# Patient Record
Sex: Male | Born: 1970 | Race: White | Hispanic: Yes | Marital: Married | Smoking: Former smoker
Health system: Southern US, Community
[De-identification: ages and names within clinical notes are randomized; demographics above are authoritative.]

## PROBLEM LIST (undated history)

## (undated) DIAGNOSIS — R222 Localized swelling, mass and lump, trunk: Secondary | ICD-10-CM

## (undated) DIAGNOSIS — R221 Localized swelling, mass and lump, neck: Secondary | ICD-10-CM

## (undated) HISTORY — DX: Localized swelling, mass and lump, trunk: R22.2

## (undated) HISTORY — PX: HERNIA REPAIR: SHX51

---

## 2003-09-29 ENCOUNTER — Emergency Department (HOSPITAL_COMMUNITY): Admission: EM | Admit: 2003-09-29 | Discharge: 2003-09-29 | Payer: Self-pay | Admitting: Emergency Medicine

## 2011-05-07 ENCOUNTER — Emergency Department (INDEPENDENT_AMBULATORY_CARE_PROVIDER_SITE_OTHER): Payer: BC Managed Care – PPO

## 2011-05-07 ENCOUNTER — Encounter (HOSPITAL_COMMUNITY): Payer: Self-pay | Admitting: *Deleted

## 2011-05-07 ENCOUNTER — Emergency Department (INDEPENDENT_AMBULATORY_CARE_PROVIDER_SITE_OTHER)
Admission: EM | Admit: 2011-05-07 | Discharge: 2011-05-07 | Disposition: A | Discharge status: Home / Self Care | Payer: BC Managed Care – PPO | Source: Home / Self Care | Attending: Emergency Medicine | Admitting: Emergency Medicine

## 2011-05-07 DIAGNOSIS — S5000XA Contusion of unspecified elbow, initial encounter: Secondary | ICD-10-CM

## 2011-05-07 DIAGNOSIS — S9000XA Contusion of unspecified ankle, initial encounter: Secondary | ICD-10-CM

## 2011-05-07 DIAGNOSIS — T148XXA Other injury of unspecified body region, initial encounter: Secondary | ICD-10-CM

## 2011-05-07 MED ORDER — IBUPROFEN 800 MG PO TABS: 800.0000 mg | ORAL_TABLET | Freq: Three times a day (TID) | ORAL | Status: AC

## 2011-05-07 NOTE — ED Notes (Signed)
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

## 2011-05-07 NOTE — ED Notes (Signed)
Elbow sleeve right elbow and ace wrap left ankle applied by tim forsythe emt

## 2011-05-07 NOTE — ED Provider Notes (Signed)
History     CSN: [id number]  Arrival date & time 05/07/11  1632   First MD Initiated Contact with Patient 05/07/11 1638      Chief Complaint  Patient presents with  . Arm Injury  . Elbow Injury  . 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 He had his seatbelt on a remain in sedentary the equipment he injured his left ankle and right elbow appears are hurting him the most. Patient denies any abdominal pain chest wall pain or headache. From area of incident came directly to urgent care to be evaluated. Denies any numbness paresthesias or weakness distal to the areas of injury.  Patient is a 40 y.o. male presenting with arm injury and lower extremity injury. The history is provided by the patient.  Arm Injury  The incident occurred today. The incident occurred at work. Injury mechanism: operating a backhoe flipped on side. The wounds were not self-inflicted. Pertinent negatives include no nausea and no neck pain.  Ankle Injury    History reviewed. No pertinent past medical history.  History reviewed. No pertinent past surgical history.  History reviewed. No pertinent family history.  History  Substance Use Topics  . Smoking status: Never Smoker   . Smokeless tobacco: Not on file  . Alcohol Use: Yes      Review of Systems  Constitutional: Negative for fever, chills, diaphoresis, activity change, appetite change and fatigue.  HENT: Positive for neck stiffness. Negative for neck pain.   Gastrointestinal: Negative for nausea.  Musculoskeletal: Positive for joint swelling.  Skin: Negative for wound.    Allergies  Review of patient's allergies indicates no known allergies.  Home Medications  No current outpatient prescriptions on file.  BP 119/68  Pulse 84  Temp(Src) 97.8 F (36.6 C) (Oral)  Resp 14  SpO2 99%  Physical Exam  Nursing  note and vitals reviewed. Constitutional: He appears well-developed and well-nourished.  HENT:  Head: Normocephalic.  Abdominal: Soft. He exhibits no distension. There is no tenderness.  Musculoskeletal: He exhibits tenderness.       Right elbow: He exhibits decreased range of motion. He exhibits no effusion and no deformity. tenderness found. Radial head, medial epicondyle, lateral epicondyle and olecranon process tenderness noted.       Arms:      Feet:  Neurological: He is alert.  Skin: No rash noted. No erythema.    ED Course  Procedures (including critical care time)  Labs Reviewed - No data to display Dg Elbow Complete Right  05/07/2011  *RADIOLOGY REPORT*  Clinical Data: Larey Seat and injured right elbow.  RIGHT ELBOW - COMPLETE 3+ VIEW 05/07/2011:  Comparison: None.  Findings: Soft tissue swelling overlying the elbow posteriorly.  No evidence of acute, subacute, or healed fractures.  Well-preserved joint spaces.  No intrinsic osseous abnormalities.  No posterior fat pad sign to confirm joint effusion or hemarthrosis.  IMPRESSION: No osseous abnormality.  Original Report Authenticated By: Arnell Sieving, M.D.   Dg Ankle Complete Left  05/07/2011  *RADIOLOGY REPORT*  Clinical Data: 41 year old male status post fall with pain.  LEFT ANKLE COMPLETE - 3+ VIEW  Comparison: None.  Findings: No joint effusion identified.  Mortise joint alignment preserved.  Talar dome intact. Bone mineralization is within normal limits.  No fracture or dislocation identified.  IMPRESSION: No acute fracture or dislocation identified about the left ankle.  Original Report Authenticated By: Harley Hallmark, M.D.  No diagnosis found.    MDM  Post accident with right elbow contusion and left ankle contusion and abrasions. X-rays were not consistent with an elbow effusion or indirect evidence of fractures. Symptomatic management and RICE approach recommended the orthopedic Dr. if symptoms were to persist  beyond 10-14 day       Jimmie Molly, MD 05/07/11 1752

## 2012-05-11 ENCOUNTER — Encounter (HOSPITAL_COMMUNITY): Payer: Self-pay | Admitting: Emergency Medicine

## 2012-05-11 ENCOUNTER — Emergency Department (HOSPITAL_COMMUNITY)
Admission: EM | Admit: 2012-05-11 | Discharge: 2012-05-11 | Disposition: A | Discharge status: Home / Self Care | Payer: BC Managed Care – PPO | Source: Home / Self Care | Attending: Emergency Medicine | Admitting: Emergency Medicine

## 2012-05-11 DIAGNOSIS — L0291 Cutaneous abscess, unspecified: Secondary | ICD-10-CM

## 2012-05-11 DIAGNOSIS — L0390 Cellulitis, unspecified: Secondary | ICD-10-CM

## 2012-05-11 MED ORDER — OXYCODONE-ACETAMINOPHEN 5-325 MG PO TABS: ORAL_TABLET | ORAL | Status: DC

## 2012-05-11 MED ORDER — IBUPROFEN 800 MG PO TABS
800.0000 mg | ORAL_TABLET | Freq: Once | ORAL | Status: AC
  Administered 2012-05-11: 800 mg via ORAL

## 2012-05-11 MED ORDER — SULFAMETHOXAZOLE-TMP DS 800-160 MG PO TABS: 2.0000 | ORAL_TABLET | Freq: Two times a day (BID) | ORAL | Status: DC

## 2012-05-11 MED ORDER — IBUPROFEN 800 MG PO TABS
ORAL_TABLET | ORAL | Status: AC
  Filled 2012-05-11: qty 1

## 2012-05-11 MED ORDER — HYDROCODONE-ACETAMINOPHEN 5-325 MG PO TABS
ORAL_TABLET | ORAL | Status: AC
  Filled 2012-05-11: qty 1

## 2012-05-11 MED ORDER — HYDROCODONE-ACETAMINOPHEN 5-325 MG PO TABS
1.0000 | ORAL_TABLET | Freq: Once | ORAL | Status: AC
  Administered 2012-05-11: 1 via ORAL

## 2012-05-11 NOTE — ED Notes (Signed)
Pt c/o abscess on abd area onset Monday Very painful w/some drainage  Denies fevers; pain is 10/10  He is alert and oriented w/no signs of acute distress.

## 2012-05-11 NOTE — ED Provider Notes (Signed)
Chief Complaint:   Chief Complaint  Patient presents with  . Abscess    History of Present Illness:    Bryce Ward is a 42 year old male who has had a one-week history of a painful abscess on his lower abdomen, just above the pubis. This is swollen, red, tender, as been draining a small amount of bloody pus. He denies any fever or chills. No prior history of abscesses or skin infections.  Review of Systems:  Other than noted above, the patient denies any of the following symptoms: Systemic:  No fever, chills or sweats. Skin:  No rash or itching.  PMFSH:  Past medical history, family history, social history, meds, and allergies were reviewed.  No history of diabetes or prior history of abscesses or MRSA.   Physical Exam:   Vital signs:  BP 150/80  Pulse 78  Temp(Src) 98.7 F (37.1 C) (Oral)  Resp 20  SpO2 100% Skin:  There is a 4 x 5 cm or, oval, raised, red, tender, abscess which is fluctuant on the lower abdomen at the midline, just above the pubis. There is a small central ulceration which is draining a small amount of serosanguineous fluid.  Skin exam was otherwise normal.  No rash. Ext:  Distal pulses were full, patient has full ROM of all joints.  Procedure:  Verbal informed consent was obtained.  The patient was informed of the risks and benefits of the procedure and understands and accepts.  Identity of the patient was verified verbally and by wristband.   The abscess area described above was prepped with Betadine and alcohol and anesthetized with 5 mL of 2% Xylocaine with epinephrine.  Using a #11 scalpel blade, a singe straight incision was made into the area of fluctulence, yielding a moderate or amount of prurulent drainage.  Routine cultures were obtained.  Blunt dissection was used to break up loculations and the resulting wound cavity was packed with 1/4 inch Iodoform gauze.  A sterile pressure dressing was applied.  Course in Urgent Care Center:   He was given Norco  5/325 and ibuprofen at a milligrams by mouth for pain.  Assessment:  The encounter diagnosis was Abscess.  Plan:   1.  The following meds were prescribed:   New Prescriptions   OXYCODONE-ACETAMINOPHEN (PERCOCET) 5-325 MG PER TABLET    1 to 2 tablets every 6 hours as needed for pain.   SULFAMETHOXAZOLE-TRIMETHOPRIM (BACTRIM DS) 800-160 MG PER TABLET    Take 2 tablets by mouth 2 (two) times daily.   2.  The patient was instructed in symptomatic care and handouts were given. 3.  The patient was instructed to leave the dressing in place and return again in 48 hours for packing removal.  Given red flag symptoms such as fever or worsening pain that would indicate earlier return.   Reuben Likes, MD 05/11/12 [phone]

## 2012-05-13 ENCOUNTER — Encounter (HOSPITAL_COMMUNITY): Payer: Self-pay | Admitting: Emergency Medicine

## 2012-05-13 ENCOUNTER — Emergency Department (INDEPENDENT_AMBULATORY_CARE_PROVIDER_SITE_OTHER)
Admission: EM | Admit: 2012-05-13 | Discharge: 2012-05-13 | Disposition: A | Discharge status: Home / Self Care | Payer: BC Managed Care – PPO | Source: Home / Self Care

## 2012-05-13 DIAGNOSIS — L0291 Cutaneous abscess, unspecified: Secondary | ICD-10-CM

## 2012-05-13 NOTE — ED Notes (Addendum)
Pt is here for possible packing removal.  Pt is still having drainage from area. Area is still swollen and red with some yellow drainage. Pt denies pain and fever.  Pt is taking antibiotics as prescribed.

## 2012-05-13 NOTE — ED Provider Notes (Signed)
History     CSN: [id number]  Arrival date & time 05/13/12  1424   First MD Initiated Contact with Patient 05/13/12 1438      Chief Complaint  Patient presents with  . Recurrent Skin Infections    pt here for possible packing removal    (Consider location/radiation/quality/duration/timing/severity/associated sxs/prior treatment) HPI Followup abscess. Patient was seen 2 days ago for skin abscess of his lower abdominal. This was treated with incision and drainage and antibiotics. In the interim he has done very well. His pain has resolved. He denies any fevers or chills. He is taking his antibiotics as directed. He notes some discharge.     History  Substance Use Topics  . Smoking status: Never Smoker   . Smokeless tobacco: Not on file  . Alcohol Use: Yes      Review of Systems no fevers or chills  Allergies  Review of patient's allergies indicates no known allergies.  Home Medications   Current Outpatient Rx  Name  Route  Sig  Dispense  Refill  . sulfamethoxazole-trimethoprim (BACTRIM DS) 800-160 MG per tablet   Oral   Take 2 tablets by mouth 2 (two) times daily.   40 tablet   0   . oxyCODONE-acetaminophen (PERCOCET) 5-325 MG per tablet      1 to 2 tablets every 6 hours as needed for pain.   20 tablet   0     There were no vitals taken for this visit.  Physical Exam GEN: Well NAD Skin: On present. No surrounding erythema. 4 inches of packing material removed.   ED Course  Procedures (including critical care time) Remove packing material. Debrided the wound with a Q-tip. Reinserted a small gauze.   Labs Reviewed - No data to display No results found.   1. Abscess       MDM  Packing removed and replaced today. Dressing reapplied. Plan to continue antibiotics. Remove packing on own in 2 weeks. Followup as needed        Rodolph Bong, MD 05/13/12 [phone]

## 2012-05-13 NOTE — ED Provider Notes (Signed)
Medical screening examination/treatment/procedure(s) were performed by -physician practitioner and as supervising physician I was immediately available for consultation/collaboration.  Shellsea Borunda   Christina Waldrop, MD 05/13/12 1607 

## 2012-05-14 LAB — CULTURE, ROUTINE-ABSCESS: Special Requests: NORMAL

## 2012-05-17 NOTE — ED Notes (Signed)
Abscess abdomen: Abundant Staph. Aureus.  Pt. adequately treated with Bactrim DS. Vassie Moselle 05/17/2012

## 2016-01-26 ENCOUNTER — Other Ambulatory Visit: Payer: Self-pay | Admitting: General Surgery

## 2016-01-26 DIAGNOSIS — R222 Localized swelling, mass and lump, trunk: Secondary | ICD-10-CM

## 2016-02-05 ENCOUNTER — Ambulatory Visit
Admission: RE | Admit: 2016-02-05 | Discharge: 2016-02-05 | Disposition: A | Discharge status: Home / Self Care | Payer: BLUE CROSS/BLUE SHIELD | Source: Ambulatory Visit | Attending: General Surgery | Admitting: General Surgery

## 2016-02-05 DIAGNOSIS — R222 Localized swelling, mass and lump, trunk: Secondary | ICD-10-CM

## 2016-02-05 MED ORDER — IOPAMIDOL (ISOVUE-300) INJECTION 61%
75.0000 mL | Freq: Once | INTRAVENOUS | Status: AC | PRN
  Administered 2016-02-05: 75 mL via INTRAVENOUS

## 2016-02-09 ENCOUNTER — Encounter: Payer: Self-pay | Admitting: *Deleted

## 2016-02-10 ENCOUNTER — Institutional Professional Consult (permissible substitution) (INDEPENDENT_AMBULATORY_CARE_PROVIDER_SITE_OTHER): Payer: BLUE CROSS/BLUE SHIELD | Admitting: Surgery

## 2016-02-10 ENCOUNTER — Other Ambulatory Visit: Payer: Self-pay | Admitting: *Deleted

## 2016-02-10 VITALS — BP 130/82 | HR 75 | Resp 16 | Ht 68.0 in | Wt 205.0 lb

## 2016-02-10 DIAGNOSIS — R221 Localized swelling, mass and lump, neck: Secondary | ICD-10-CM

## 2016-02-10 DIAGNOSIS — R222 Localized swelling, mass and lump, trunk: Secondary | ICD-10-CM | POA: Diagnosis not present

## 2016-02-10 NOTE — Progress Notes (Signed)
Cardiothoracic Surgery Consultation   PCP is No primary care provider on file. Referring Provider is Jovita Kussmaul, MD  Chief Complaint  Patient presents with  . Referral    supraclavicular...CT CHEST 02/05/16    HPI:  The patient is a 46 year old Hispanic gentleman who does not speak English but is here with his daughter who does speak [place] well. The patient and his wife, who is also here, understand some [place]. He has noticed a mass at the base of the right neck for about the past two years. It has been slowly growing. It has not caused much symptoms so he has ignored it for the most part. He was reportedly seen by a physician at some point and put on antibiotics but it did not resolve and he never went back. He does note some mild discomfort with raising the right arm above his head. He denies any fever or chills, night sweats, weight loss or loss of appetite. He has not noted any change in his voice or difficulty swallowing. He was seen by Dr. Marlou Starks on 01/22/2016 and subsequently had a chest CT to evaluate this. It shows a 5 cm intermediate density mass posterior to the lower right SCM muscle. There is no significant lymphadenopathy and no other significant abnormality.  Past Medical History:  Diagnosis Date  . Supraclavicular mass     History reviewed. No pertinent surgical history.  Family History  Problem Relation Age of Onset  . Diabetes Mother     Social History Social History  Substance Use Topics  . Smoking status: Former Smoker    Packs/day: 1.00    Years: 20.00    Quit date: 02/10/2007  . Smokeless tobacco: Never Used  . Alcohol use Yes     Comment: occasional, FEW BEERS ONCE A WEEK    No current outpatient prescriptions on file.   No current facility-administered medications for this visit.     No Known Allergies  Review of Systems  Constitutional: Negative.  Negative for appetite change, fever and unexpected weight change.  HENT: Negative for  dental problem, ear pain, facial swelling, mouth sores, sinus pain, sinus pressure, sore throat, trouble swallowing and voice change.   Eyes: Negative.   Respiratory: Negative.  Negative for cough, choking, shortness of breath and wheezing.   Cardiovascular: Negative for chest pain.  Gastrointestinal: Negative.   Endocrine: Negative.   Genitourinary: Negative.   Musculoskeletal: Negative.   Skin: Negative.   Neurological: Negative.   Hematological: Negative.   Psychiatric/Behavioral: Negative.     BP 130/82 (BP Location: Right Arm, Patient Position: Sitting, Cuff Size: Large)   Pulse 75   Resp 16   Ht 5\' 8"  (1.727 m)   Wt 205 lb (93 kg)   BMI 31.17 kg/m  Physical Exam  Constitutional: He is oriented to person, place, and time. He appears well-developed and well-nourished. No distress.  HENT:  Head: Normocephalic and atraumatic.  Mouth/Throat: Oropharynx is clear and moist.  Eyes: EOM are normal.  Neck: Normal range of motion. Neck supple. No JVD present. No tracheal deviation present. No thyromegaly present.  Firm mass palpable deep to the right lower SCM muscle that is not particularly mobile.  Cardiovascular: Normal rate, regular rhythm and normal heart sounds.   No murmur heard. Pulmonary/Chest: Effort normal. No stridor. No respiratory distress. He has no wheezes.  Abdominal: Soft. Bowel sounds are normal. He exhibits no distension. There is no tenderness.  Musculoskeletal: Normal range of motion.  He exhibits no edema.  Lymphadenopathy:    He has no cervical adenopathy.  Neurological: He is alert and oriented to person, place, and time. No cranial nerve deficit or sensory deficit.  Skin: Skin is warm and dry.  Tattoos over chest that are old according to patient.  Psychiatric: He has a normal mood and affect.     Diagnostic Tests:  CLINICAL DATA:  46 year old male with right supraclavicular mass discovered 8 months ago, non painful, although the patient does  have discomfort when he raises is right arm. Initial encounter.  EXAM: CT CHEST WITH CONTRAST  TECHNIQUE: Multidetector CT imaging of the chest was performed during intravenous contrast administration.  CONTRAST:  63mL ISOVUE-300 IOPAMIDOL (ISOVUE-300) INJECTION 61%  COMPARISON:  None.  FINDINGS: Cardiovascular: Negative. No pericardial effusion. No calcified atherosclerosis is evident.  Mediastinum/Nodes: Negative, small mediastinal lymph nodes are within normal limits. No mediastinal mass.  Lungs/Pleura: Major airways are patent. The right lung is clear aside from mild dependent atelectasis in the form of depending ground-glass opacity. The left lung is similarly clear aside from minor dependent atelectasis. No pleural effusion.  Upper Abdomen: Negative visualized liver, gallbladder, spleen, pancreas, adrenal glands, kidneys, and bowel in the upper abdomen.  Musculoskeletal: No osseous abnormality identified.  Other findings: Circumscribed 35 by 50 x 41 mm (AP by transverse by CC) intermediate density mass at the right floor aspect inlet corresponding to the right level 4 nodal station, lateral to the lower right carotid space and deep to the lower right sternocleidomastoid muscle. See series 3, images 15 and 16 and coronal images 48 and 49. Regional mass effect including mild mass effect on the nearby right thyroid lobe which otherwise appears normal. The mass is 60-70 Hounsfield units, similar to skeletal muscle.  Other visible thoracic inlet and level 3 lymph nodes are diminutive and normal. No other thoracic or visible lower neck soft tissue abnormality.  No axillary lymphadenopathy.  IMPRESSION: 1. Positive for a 5 cm round intermediate density soft tissue mass at the right thoracic inlet. The mass is occupying the right level 4 nodal station but there is no other lower neck or chest lymphadenopathy, and no related thyroid or mediastinal  mass. Consider Hodgkin lymphoma, and query B symptoms. Recommend histologic evaluation with either Ultrasound-guided (core) needle biopsy versus a Surgical biopsy. 2. Otherwise negative CT appearance of the chest and upper abdomen. These results will be called to the ordering clinician or representative by the Radiologist Assistant, and communication documented in the PACS or zVision Dashboard.   Electronically Signed   By: Genevie Ann M.D.   On: 02/05/2016 10:01   Impression:  The otherwise healthy smoker has a 5 cm firm mass beneath the right SCM muscle that has been slowly enlarging over the past two years according to him. On CT this does not appear to be actively invading any structures and its slow growth over two years makes me think more about a benign process although lymphoma is still on the list. It looks like it is amenable to removal through an incision along the lower right anterior SCM muscle. I told him that there is a possibility that I may not be able to completely remove it if it is attached to any vital structures. I discussed the surgical procedure with him and his wife via his daughter as Optometrist. I discussed the alternative of a core needle biopsy which I don't think is best for him. I discussed the benefits and risks of the surgery  including but not limited to bleeding, infection, injury to surrounding structures, a space where the tumor is removed, incomplete removal and recurrence. I also discussed the possibility that he will require additional treatment depending on what this turns out to be. He says that he understands and agrees to proceed. I reviewed the CT scan films with them and answered all of their questions.  Plan:  Excisional biopsy of the right neck mass on Tuesday 02/16/2016.   I spent 30 minutes performing this consultation and > 50% of this time was spent face to face counseling and coordinating the care of this patient's right neck mass.  Gaye Pollack, MD [facility] [phone]

## 2016-02-15 ENCOUNTER — Encounter (HOSPITAL_COMMUNITY): Payer: Self-pay | Admitting: *Deleted

## 2016-02-15 NOTE — Progress Notes (Signed)
Pt SDW-Pre-op call completed by interpreter Alex # [phone]. Pt denies SOB, chest pain, and being under the care of a cardiologist. Pt denies having a stress test, echo and cardiac cath. Pt denies having a chest x ray and EKG within the last year. Pt made aware to stop taking Aspirin, vitamins, fish oil and herbal medications. Do not take any NSAIDs ie: Ibuprofen, Advil, Naproxen ( Anaprox), BC and Goody Powder or any medication containing Aspirin. Pt verbalized understanding of all pre-op instructions.

## 2016-02-16 ENCOUNTER — Observation Stay (HOSPITAL_COMMUNITY)
Admission: AD | Admit: 2016-02-16 | Discharge: 2016-02-17 | DRG: 572 | Disposition: A | Discharge status: Home / Self Care | Payer: BLUE CROSS/BLUE SHIELD | Source: Ambulatory Visit | Attending: Surgery | Admitting: Surgery

## 2016-02-16 ENCOUNTER — Encounter (HOSPITAL_COMMUNITY)
Admission: AD | Disposition: A | Discharge status: Home / Self Care | Payer: Self-pay | Source: Ambulatory Visit | Attending: Surgery

## 2016-02-16 ENCOUNTER — Ambulatory Visit (HOSPITAL_COMMUNITY): Payer: BLUE CROSS/BLUE SHIELD | Admitting: Certified Registered Nurse Anesthetist

## 2016-02-16 ENCOUNTER — Inpatient Hospital Stay (HOSPITAL_COMMUNITY): Payer: BLUE CROSS/BLUE SHIELD

## 2016-02-16 ENCOUNTER — Encounter (HOSPITAL_COMMUNITY): Payer: Self-pay | Admitting: *Deleted

## 2016-02-16 ENCOUNTER — Ambulatory Visit (HOSPITAL_COMMUNITY): Payer: BLUE CROSS/BLUE SHIELD

## 2016-02-16 DIAGNOSIS — D1801 Hemangioma of skin and subcutaneous tissue: Secondary | ICD-10-CM | POA: Diagnosis not present

## 2016-02-16 DIAGNOSIS — M7981 Nontraumatic hematoma of soft tissue: Principal | ICD-10-CM | POA: Insufficient documentation

## 2016-02-16 DIAGNOSIS — R222 Localized swelling, mass and lump, trunk: Secondary | ICD-10-CM

## 2016-02-16 DIAGNOSIS — R221 Localized swelling, mass and lump, neck: Secondary | ICD-10-CM

## 2016-02-16 DIAGNOSIS — Z87891 Personal history of nicotine dependence: Secondary | ICD-10-CM | POA: Diagnosis not present

## 2016-02-16 HISTORY — PX: MASS EXCISION: SHX2000

## 2016-02-16 HISTORY — DX: Localized swelling, mass and lump, neck: R22.1

## 2016-02-16 LAB — CBC
HEMATOCRIT: 48 % (ref 39.0–52.0)
HEMOGLOBIN: 16.4 g/dL (ref 13.0–17.0)
MCH: 30.4 pg (ref 26.0–34.0)
MCHC: 34.2 g/dL (ref 30.0–36.0)
MCV: 88.9 fL (ref 78.0–100.0)
PLATELETS: 156 10*3/uL (ref 150–400)
RBC: 5.4 MIL/uL (ref 4.22–5.81)
RDW: 13 % (ref 11.5–15.5)
WBC: 5.8 10*3/uL (ref 4.0–10.5)

## 2016-02-16 LAB — COMPREHENSIVE METABOLIC PANEL
ALT: 28 U/L (ref 17–63)
ANION GAP: 9 (ref 5–15)
AST: 28 U/L (ref 15–41)
Albumin: 4.1 g/dL (ref 3.5–5.0)
Alkaline Phosphatase: 89 U/L (ref 38–126)
BUN: 12 mg/dL (ref 6–20)
CHLORIDE: 100 mmol/L — AB (ref 101–111)
CO2: 28 mmol/L (ref 22–32)
Calcium: 9.4 mg/dL (ref 8.9–10.3)
Creatinine, Ser: 0.8 mg/dL (ref 0.61–1.24)
Glucose, Bld: 125 mg/dL — ABNORMAL HIGH (ref 65–99)
POTASSIUM: 3.1 mmol/L — AB (ref 3.5–5.1)
Sodium: 137 mmol/L (ref 135–145)
Total Bilirubin: 1.2 mg/dL (ref 0.3–1.2)
Total Protein: 7.7 g/dL (ref 6.5–8.1)

## 2016-02-16 LAB — SURGICAL PCR SCREEN
MRSA, PCR: NEGATIVE
Staphylococcus aureus: NEGATIVE

## 2016-02-16 LAB — TYPE AND SCREEN
ABO/RH(D): A POS
ANTIBODY SCREEN: NEGATIVE

## 2016-02-16 LAB — PROTIME-INR
INR: 0.99
PROTHROMBIN TIME: 13.1 s (ref 11.4–15.2)

## 2016-02-16 LAB — ABO/RH: ABO/RH(D): A POS

## 2016-02-16 LAB — APTT: APTT: 29 s (ref 24–36)

## 2016-02-16 SURGERY — EXCISION MASS
Anesthesia: General | Site: Neck | Laterality: Right

## 2016-02-16 MED ORDER — SODIUM CHLORIDE 0.9 % IV SOLN
30.0000 meq | Freq: Every day | INTRAVENOUS | Status: DC | PRN
  Administered 2016-02-16: 30 meq via INTRAVENOUS
  Filled 2016-02-16 (×3): qty 15

## 2016-02-16 MED ORDER — 0.9 % SODIUM CHLORIDE (POUR BTL) OPTIME
TOPICAL | Status: DC | PRN
  Administered 2016-02-16: 1000 mL

## 2016-02-16 MED ORDER — HYDROMORPHONE HCL 1 MG/ML IJ SOLN
INTRAMUSCULAR | Status: AC
  Filled 2016-02-16: qty 0.5

## 2016-02-16 MED ORDER — HEPARIN SODIUM (PORCINE) 1000 UNIT/ML IJ SOLN
INTRAMUSCULAR | Status: AC
  Filled 2016-02-16: qty 2

## 2016-02-16 MED ORDER — POVIDONE-IODINE 10 % EX OINT
TOPICAL_OINTMENT | CUTANEOUS | Status: AC
  Filled 2016-02-16: qty 28.35

## 2016-02-16 MED ORDER — FENTANYL CITRATE (PF) 250 MCG/5ML IJ SOLN
INTRAMUSCULAR | Status: AC
  Filled 2016-02-16: qty 5

## 2016-02-16 MED ORDER — MUPIROCIN 2 % EX OINT
1.0000 "application " | TOPICAL_OINTMENT | Freq: Once | CUTANEOUS | Status: AC
  Administered 2016-02-16: 1 via TOPICAL
  Filled 2016-02-16: qty 22

## 2016-02-16 MED ORDER — EPHEDRINE SULFATE 50 MG/ML IJ SOLN
INTRAMUSCULAR | Status: DC | PRN
  Administered 2016-02-16: 5 mg via INTRAVENOUS

## 2016-02-16 MED ORDER — ACETAMINOPHEN 160 MG/5ML PO SOLN: 1000.0000 mg | Freq: Four times a day (QID) | ORAL | Status: DC

## 2016-02-16 MED ORDER — LIDOCAINE 2% (20 MG/ML) 5 ML SYRINGE
INTRAMUSCULAR | Status: DC | PRN
  Administered 2016-02-16: 100 mg via INTRAVENOUS

## 2016-02-16 MED ORDER — DEXTROSE-NACL 5-0.9 % IV SOLN: INTRAVENOUS | Status: DC

## 2016-02-16 MED ORDER — ONDANSETRON HCL 4 MG/2ML IJ SOLN
INTRAMUSCULAR | Status: AC
  Filled 2016-02-16: qty 2

## 2016-02-16 MED ORDER — ACETAMINOPHEN 500 MG PO TABS: 1000.0000 mg | ORAL_TABLET | Freq: Four times a day (QID) | ORAL | Status: DC

## 2016-02-16 MED ORDER — SENNOSIDES-DOCUSATE SODIUM 8.6-50 MG PO TABS: 1.0000 | ORAL_TABLET | Freq: Every day | ORAL | Status: DC

## 2016-02-16 MED ORDER — LIDOCAINE HCL (CARDIAC) 20 MG/ML IV SOLN
INTRAVENOUS | Status: DC | PRN
  Administered 2016-02-16: 100 mg via INTRAVENOUS

## 2016-02-16 MED ORDER — MIDAZOLAM HCL 2 MG/2ML IJ SOLN
INTRAMUSCULAR | Status: AC
  Filled 2016-02-16: qty 2

## 2016-02-16 MED ORDER — TRAMADOL HCL 50 MG PO TABS: 50.0000 mg | ORAL_TABLET | Freq: Four times a day (QID) | ORAL | Status: DC | PRN

## 2016-02-16 MED ORDER — PROMETHAZINE HCL 25 MG/ML IJ SOLN
6.2500 mg | INTRAMUSCULAR | Status: DC | PRN
Start: 2016-02-16 — End: 2016-02-16

## 2016-02-16 MED ORDER — LIDOCAINE HCL (PF) 1 % IJ SOLN
INTRAMUSCULAR | Status: AC
  Filled 2016-02-16: qty 30

## 2016-02-16 MED ORDER — FENTANYL CITRATE (PF) 100 MCG/2ML IJ SOLN: 25.0000 ug | INTRAMUSCULAR | Status: DC | PRN

## 2016-02-16 MED ORDER — EPINEPHRINE PF 1 MG/10ML IJ SOSY
PREFILLED_SYRINGE | INTRAMUSCULAR | Status: AC
  Filled 2016-02-16: qty 10

## 2016-02-16 MED ORDER — MIDAZOLAM HCL 5 MG/5ML IJ SOLN
INTRAMUSCULAR | Status: DC | PRN
  Administered 2016-02-16: 2 mg via INTRAVENOUS

## 2016-02-16 MED ORDER — HYDROMORPHONE HCL 1 MG/ML IJ SOLN
0.2500 mg | INTRAMUSCULAR | Status: DC | PRN
  Administered 2016-02-16 (×2): 0.5 mg via INTRAVENOUS

## 2016-02-16 MED ORDER — ONDANSETRON HCL 4 MG/2ML IJ SOLN
INTRAMUSCULAR | Status: DC | PRN
  Administered 2016-02-16: 4 mg via INTRAVENOUS

## 2016-02-16 MED ORDER — OXYCODONE HCL 5 MG PO TABS: 5.0000 mg | ORAL_TABLET | ORAL | Status: DC | PRN

## 2016-02-16 MED ORDER — SUGAMMADEX SODIUM 200 MG/2ML IV SOLN
INTRAVENOUS | Status: AC
  Filled 2016-02-16: qty 2

## 2016-02-16 MED ORDER — PROPOFOL 10 MG/ML IV BOLUS
INTRAVENOUS | Status: AC
  Filled 2016-02-16: qty 40

## 2016-02-16 MED ORDER — DEXTROSE 5 % IV SOLN
1.5000 g | INTRAVENOUS | Status: AC
  Administered 2016-02-16: 1.5 g via INTRAVENOUS
  Filled 2016-02-16: qty 1.5

## 2016-02-16 MED ORDER — LIDOCAINE 2% (20 MG/ML) 5 ML SYRINGE
INTRAMUSCULAR | Status: AC
  Filled 2016-02-16: qty 10

## 2016-02-16 MED ORDER — PROPOFOL 10 MG/ML IV BOLUS
INTRAVENOUS | Status: DC | PRN
  Administered 2016-02-16: 200 mg via INTRAVENOUS

## 2016-02-16 MED ORDER — ROCURONIUM BROMIDE 50 MG/5ML IV SOSY
PREFILLED_SYRINGE | INTRAVENOUS | Status: AC
  Filled 2016-02-16: qty 5

## 2016-02-16 MED ORDER — PROTAMINE SULFATE 10 MG/ML IV SOLN
INTRAVENOUS | Status: AC
  Filled 2016-02-16: qty 10

## 2016-02-16 MED ORDER — ETOMIDATE 2 MG/ML IV SOLN
INTRAVENOUS | Status: AC
  Filled 2016-02-16: qty 10

## 2016-02-16 MED ORDER — LACTATED RINGERS IV SOLN
INTRAVENOUS | Status: DC
  Administered 2016-02-16 (×3): via INTRAVENOUS

## 2016-02-16 MED ORDER — EPHEDRINE 5 MG/ML INJ
INTRAVENOUS | Status: AC
  Filled 2016-02-16: qty 10

## 2016-02-16 MED ORDER — BISACODYL 5 MG PO TBEC: 10.0000 mg | DELAYED_RELEASE_TABLET | Freq: Every day | ORAL | Status: DC

## 2016-02-16 MED ORDER — ONDANSETRON HCL 4 MG/2ML IJ SOLN
4.0000 mg | Freq: Four times a day (QID) | INTRAMUSCULAR | Status: DC | PRN
  Administered 2016-02-16: 4 mg via INTRAVENOUS
  Filled 2016-02-16: qty 2

## 2016-02-16 MED ORDER — FENTANYL CITRATE (PF) 100 MCG/2ML IJ SOLN
INTRAMUSCULAR | Status: DC | PRN
  Administered 2016-02-16: 50 ug via INTRAVENOUS
  Administered 2016-02-16 (×4): 25 ug via INTRAVENOUS
  Administered 2016-02-16: 50 ug via INTRAVENOUS
  Administered 2016-02-16 (×2): 25 ug via INTRAVENOUS

## 2016-02-16 SURGICAL SUPPLY — 50 items
BAG DECANTER FOR FLEXI CONT (MISCELLANEOUS) IMPLANT
CANISTER SUCTION 2500CC (MISCELLANEOUS) ×3 IMPLANT
CLIP TI MEDIUM 6 (CLIP) ×10 IMPLANT
CLIP TI WIDE RED SMALL 6 (CLIP) ×2 IMPLANT
CONT SPEC 4OZ CLIKSEAL STRL BL (MISCELLANEOUS) ×2 IMPLANT
COVER SURGICAL LIGHT HANDLE (MISCELLANEOUS) ×5 IMPLANT
DRAIN CHANNEL 15F RND FF W/TCR (WOUND CARE) ×2 IMPLANT
DRAPE LAPAROSCOPIC ABDOMINAL (DRAPES) ×3 IMPLANT
ELECT BLADE 4.0 EZ CLEAN MEGAD (MISCELLANEOUS) ×3
ELECT REM PT RETURN 9FT ADLT (ELECTROSURGICAL) ×3
ELECTRODE BLDE 4.0 EZ CLN MEGD (MISCELLANEOUS) IMPLANT
ELECTRODE REM PT RTRN 9FT ADLT (ELECTROSURGICAL) ×1 IMPLANT
EVACUATOR SILICONE 100CC (DRAIN) ×2 IMPLANT
GAUZE SPONGE 4X4 12PLY STRL (GAUZE/BANDAGES/DRESSINGS) ×3 IMPLANT
GLOVE BIOGEL PI IND STRL 6.5 (GLOVE) IMPLANT
GLOVE BIOGEL PI INDICATOR 6.5 (GLOVE) ×4
GLOVE EUDERMIC 7 POWDERFREE (GLOVE) ×5 IMPLANT
GLOVE SURG SS PI 6.0 STRL IVOR (GLOVE) ×4 IMPLANT
GOWN STRL REUS W/ TWL LRG LVL3 (GOWN DISPOSABLE) ×2 IMPLANT
GOWN STRL REUS W/ TWL XL LVL3 (GOWN DISPOSABLE) ×1 IMPLANT
GOWN STRL REUS W/TWL LRG LVL3 (GOWN DISPOSABLE) ×15
GOWN STRL REUS W/TWL XL LVL3 (GOWN DISPOSABLE) ×3
KIT BASIN OR (CUSTOM PROCEDURE TRAY) ×3 IMPLANT
KIT ROOM TURNOVER OR (KITS) ×3 IMPLANT
LIQUID BAND (GAUZE/BANDAGES/DRESSINGS) ×2 IMPLANT
LOOP VESSEL MAXI BLUE (MISCELLANEOUS) ×2 IMPLANT
NS IRRIG 1000ML POUR BTL (IV SOLUTION) ×3 IMPLANT
PACK GENERAL/GYN (CUSTOM PROCEDURE TRAY) ×3 IMPLANT
PAD ARMBOARD 7.5X6 YLW CONV (MISCELLANEOUS) ×6 IMPLANT
PENCIL BUTTON HOLSTER BLD 10FT (ELECTRODE) ×2 IMPLANT
SPONGE GAUZE 4X4 12PLY STER LF (GAUZE/BANDAGES/DRESSINGS) ×2 IMPLANT
SPONGE INTESTINAL PEANUT (DISPOSABLE) ×2 IMPLANT
SPONGE LAP 18X18 X RAY DECT (DISPOSABLE) IMPLANT
SUT SILK 1 TIES 10X30 (SUTURE) ×3 IMPLANT
SUT SILK 2 0 (SUTURE) ×9
SUT SILK 2 0 SH CR/8 (SUTURE) ×2 IMPLANT
SUT SILK 2 0SH CR/8 30 (SUTURE) ×2 IMPLANT
SUT SILK 2-0 18XBRD TIE 12 (SUTURE) ×1 IMPLANT
SUT VIC AB 2-0 CT1 27 (SUTURE)
SUT VIC AB 2-0 CT1 TAPERPNT 27 (SUTURE) IMPLANT
SUT VIC AB 3-0 SH 27 (SUTURE) ×3
SUT VIC AB 3-0 SH 27X BRD (SUTURE) IMPLANT
SUT VIC AB 3-0 X1 27 (SUTURE) IMPLANT
SWAB COLLECTION DEVICE MRSA (MISCELLANEOUS) IMPLANT
SYR 30ML LL (SYRINGE) ×2 IMPLANT
TAPE CLOTH SURG 4X10 WHT LF (GAUZE/BANDAGES/DRESSINGS) ×2 IMPLANT
TOWEL OR 17X24 6PK STRL BLUE (TOWEL DISPOSABLE) ×3 IMPLANT
TOWEL OR 17X26 10 PK STRL BLUE (TOWEL DISPOSABLE) ×3 IMPLANT
TUBE ANAEROBIC SPECIMEN COL (MISCELLANEOUS) IMPLANT
WATER STERILE IRR 1000ML POUR (IV SOLUTION) ×3 IMPLANT

## 2016-02-16 NOTE — Anesthesia Preprocedure Evaluation (Signed)
Anesthesia Evaluation  Patient identified by MRN, date of birth, ID band Patient awake    Reviewed: Allergy & Precautions, NPO status , Patient's Chart, lab work & pertinent test results  Airway Mallampati: II       Dental  (+) Teeth Intact, Dental Advisory Given   Pulmonary neg pulmonary ROS, former smoker,    breath sounds clear to auscultation       Cardiovascular negative cardio ROS   Rhythm:Regular Rate:Normal     Neuro/Psych negative neurological ROS     GI/Hepatic negative GI ROS, Neg liver ROS,   Endo/Other  negative endocrine ROS  Renal/GU negative Renal ROS     Musculoskeletal   Abdominal   Peds  Hematology negative hematology ROS (+)   Anesthesia Other Findings   Reproductive/Obstetrics                             Anesthesia Physical Anesthesia Plan  ASA: I  Anesthesia Plan: General   Post-op Pain Management:    Induction: Intravenous  Airway Management Planned: LMA  Additional Equipment:   Intra-op Plan:   Post-operative Plan: Extubation in OR  Informed Consent: I have reviewed the patients History and Physical, chart, labs and discussed the procedure including the risks, benefits and alternatives for the proposed anesthesia with the patient or authorized representative who has indicated his/her understanding and acceptance.   Dental advisory given  Plan Discussed with:   Anesthesia Plan Comments:         Anesthesia Quick Evaluation

## 2016-02-16 NOTE — Anesthesia Procedure Notes (Signed)
Procedure Name: LMA Insertion Date/Time: 02/16/2016 11:42 AM Performed by: Valda Favia Pre-anesthesia Checklist: Patient identified, Emergency Drugs available, Suction available, Patient being monitored and Timeout performed Patient Re-evaluated:Patient Re-evaluated prior to inductionOxygen Delivery Method: Circle system utilized Preoxygenation: Pre-oxygenation with 100% oxygen Intubation Type: IV induction LMA: LMA inserted LMA Size: 5.0 Number of attempts: 1 Placement Confirmation: positive ETCO2 and breath sounds checked- equal and bilateral Tube secured with: Tape Dental Injury: Teeth and Oropharynx as per pre-operative assessment

## 2016-02-16 NOTE — Interval H&P Note (Signed)
History and Physical Interval Note:  02/16/2016 8:40 AM  Bryce Ward  has presented today for surgery, with the diagnosis of RIGHT NECK MASS  The various methods of treatment have been discussed with the patient and family. After consideration of risks, benefits and other options for treatment, the patient has consented to  Procedure(s): EXCISIONAL BIOPSY OF RIGHT NECK MASS (Right) as a surgical intervention .  The patient's history has been reviewed, patient examined, no change in status, stable for surgery.  I have reviewed the patient's chart and labs.  Questions were answered to the patient's satisfaction.     Gaye Pollack

## 2016-02-16 NOTE — Brief Op Note (Signed)
02/16/2016  3:38 PM  PATIENT:  Bryce Ward  46 y.o. male  PRE-OPERATIVE DIAGNOSIS:  RIGHT NECK MASS  POST-OPERATIVE DIAGNOSIS:  RIGHT NECK MASS  PROCEDURE:  Procedure(s): EXCISIONAL BIOPSY OF RIGHT NECK MASS (Right)  SURGEON:  Surgeon(s) and Role:    * Gaye Pollack, MD - Primary  PHYSICIAN ASSISTANT: Nicholes Rough, PA-C    ANESTHESIA:   general  EBL:  Total I/O In: 1000 [I.V.:1000] Out: -   BLOOD ADMINISTERED:none  DRAINS: (one 38F) Jackson-Pratt drain(s) with closed bulb suction in the right neck   LOCAL MEDICATIONS USED:  NONE  SPECIMEN:  Source of Specimen:  right supraclavicular mass  DISPOSITION OF SPECIMEN:  PATHOLOGY  COUNTS:  YES  TOURNIQUET:  * No tourniquets in log *  DICTATION: .Note written in EPIC  PLAN OF CARE: Admit to inpatient   PATIENT DISPOSITION:  PACU - hemodynamically stable.   Delay start of Pharmacological VTE agent (>24hrs) due to surgical blood loss or risk of bleeding: yes

## 2016-02-16 NOTE — Transfer of Care (Signed)
Immediate Anesthesia Transfer of Care Note  Patient: Temesgen Juedes  Procedure(s) Performed: Procedure(s): EXCISIONAL BIOPSY OF RIGHT NECK MASS (Right)  Patient Location: PACU  Anesthesia Type:General  Level of Consciousness: awake and alert   Airway & Oxygen Therapy: Patient Spontanous Breathing and Patient connected to nasal cannula oxygen  Post-op Assessment: Report given to RN and Post -op Vital signs reviewed and stable  Post vital signs: Reviewed and stable  Last Vitals:  Vitals:   02/16/16 0846 02/16/16 1557  BP: 131/80 122/77  Pulse: 80 90  Resp: 18 12  Temp: 36.8 C 36.3 C    Last Pain:  Vitals:   02/16/16 1557  TempSrc:   PainSc: Asleep      Patients Stated Pain Goal: 5 ([id number])  Complications: No apparent anesthesia complications

## 2016-02-16 NOTE — H&P (Signed)
[address]       [place] [address]             [phone]      Cardiothoracic Surgery History and Physical   PCP is No primary care provider on file. Referring Provider is Jovita Kussmaul, MD      Chief Complaint  Patient presents with  .     Right Supraclavicular mass    HPI:  The patient is a 46 year old Hispanic gentleman who does not speak English but is here with his daughter who does speak [place] well. The patient and his wife, who is also here, understand some [place]. He has noticed a mass at the base of the right neck for about the past two years. It has been slowly growing. It has not caused much symptoms so he has ignored it for the most part. He was reportedly seen by a physician at some point and put on antibiotics but it did not resolve and he never went back. He does note some mild discomfort with raising the right arm above his head. He denies any fever or chills, night sweats, weight loss or loss of appetite. He has not noted any change in his voice or difficulty swallowing. He was seen by Dr. Marlou Starks on 01/22/2016 and subsequently had a chest CT to evaluate this. It shows a 5 cm intermediate density mass posterior to the lower right SCM muscle. There is no significant lymphadenopathy and no other significant abnormality.      Past Medical History:  Diagnosis Date  . Supraclavicular mass     History reviewed. No pertinent surgical history.       Family History  Problem Relation Age of Onset  . Diabetes Mother     Social History        Social History  Substance Use Topics  . Smoking status: Former Smoker    Packs/day: 1.00    Years: 20.00    Quit date: 02/10/2007  . Smokeless tobacco: Never Used  . Alcohol use Yes      Comment: occasional, FEW BEERS ONCE A WEEK    No current outpatient prescriptions on file.   No current facility-administered medications for this visit.     No Known  Allergies  Review of Systems  Constitutional: Negative.  Negative for appetite change, fever and unexpected weight change.  HENT: Negative for dental problem, ear pain, facial swelling, mouth sores, sinus pain, sinus pressure, sore throat, trouble swallowing and voice change.   Eyes: Negative.   Respiratory: Negative.  Negative for cough, choking, shortness of breath and wheezing.   Cardiovascular: Negative for chest pain.  Gastrointestinal: Negative.   Endocrine: Negative.   Genitourinary: Negative.   Musculoskeletal: Negative.   Skin: Negative.   Neurological: Negative.   Hematological: Negative.   Psychiatric/Behavioral: Negative.     BP 130/82 (BP Location: Right Arm, Patient Position: Sitting, Cuff Size: Large)   Pulse 75   Resp 16   Ht 5\' 8"  (1.727 m)   Wt 205 lb (93 kg)   BMI 31.17 kg/m  Physical Exam  Constitutional: He is oriented to person, place, and time. He appears well-developed and well-nourished. No distress.  HENT:  Head: Normocephalic and atraumatic.  Mouth/Throat: Oropharynx is clear and moist.  Eyes: EOM are normal.  Neck: Normal range of motion. Neck supple. No JVD present. No tracheal deviation present. No thyromegaly present.  Firm mass palpable deep to the right  lower SCM muscle that is not particularly mobile.  Cardiovascular: Normal rate, regular rhythm and normal heart sounds.   No murmur heard. Pulmonary/Chest: Effort normal. No stridor. No respiratory distress. He has no wheezes.  Abdominal: Soft. Bowel sounds are normal. He exhibits no distension. There is no tenderness.  Musculoskeletal: Normal range of motion. He exhibits no edema.  Lymphadenopathy:    He has no cervical adenopathy.  Neurological: He is alert and oriented to person, place, and time. No cranial nerve deficit or sensory deficit.  Skin: Skin is warm and dry.  Tattoos over chest that are old according to patient.  Psychiatric: He has a normal mood and affect.      Diagnostic Tests:  CLINICAL DATA: 46 year old male with right supraclavicular mass discovered 8 months ago, non painful, although the patient does have discomfort when he raises is right arm. Initial encounter.  EXAM: CT CHEST WITH CONTRAST  TECHNIQUE: Multidetector CT imaging of the chest was performed during intravenous contrast administration.  CONTRAST: 10mL ISOVUE-300 IOPAMIDOL (ISOVUE-300) INJECTION 61%  COMPARISON: None.  FINDINGS: Cardiovascular: Negative. No pericardial effusion. No calcified atherosclerosis is evident.  Mediastinum/Nodes: Negative, small mediastinal lymph nodes are within normal limits. No mediastinal mass.  Lungs/Pleura: Major airways are patent. The right lung is clear aside from mild dependent atelectasis in the form of depending ground-glass opacity. The left lung is similarly clear aside from minor dependent atelectasis. No pleural effusion.  Upper Abdomen: Negative visualized liver, gallbladder, spleen, pancreas, adrenal glands, kidneys, and bowel in the upper abdomen.  Musculoskeletal: No osseous abnormality identified.  Other findings: Circumscribed 35 by 50 x 41 mm (AP by transverse by CC) intermediate density mass at the right floor aspect inlet corresponding to the right level 4 nodal station, lateral to the lower right carotid space and deep to the lower right sternocleidomastoid muscle. See series 3, images 15 and 16 and coronal images 48 and 49. Regional mass effect including mild mass effect on the nearby right thyroid lobe which otherwise appears normal. The mass is 60-70 Hounsfield units, similar to skeletal muscle.  Other visible thoracic inlet and level 3 lymph nodes are diminutive and normal. No other thoracic or visible lower neck soft tissue abnormality.  No axillary lymphadenopathy.  IMPRESSION: 1. Positive for a 5 cm round intermediate density soft tissue mass at the right thoracic  inlet. The mass is occupying the right level 4 nodal station but there is no other lower neck or chest lymphadenopathy, and no related thyroid or mediastinal mass. Consider Hodgkin lymphoma, and query B symptoms. Recommend histologic evaluation with either Ultrasound-guided (core) needle biopsy versus a Surgical biopsy. 2. Otherwise negative CT appearance of the chest and upper abdomen. These results will be called to the ordering clinician or representative by the Radiologist Assistant, and communication documented in the PACS or zVision Dashboard.   Electronically Signed By: Genevie Ann M.D. On: 02/05/2016 10:01   Impression:  The otherwise healthy smoker has a 5 cm firm mass beneath the right SCM muscle that has been slowly enlarging over the past two years according to him. On CT this does not appear to be actively invading any structures and its slow growth over two years makes me think more about a benign process although lymphoma is still on the list. It looks like it is amenable to removal through an incision along the lower right anterior SCM muscle. I told him that there is a possibility that I may not be able to completely remove it if  it is attached to any vital structures. I discussed the surgical procedure with him and his wife via his daughter as Optometrist. I discussed the alternative of a core needle biopsy which I don't think is best for him. I discussed the benefits and risks of the surgery including but not limited to bleeding, infection, injury to surrounding structures, a space where the tumor is removed, incomplete removal and recurrence. I also discussed the possibility that he will require additional treatment depending on what this turns out to be. He says that he understands and agrees to proceed. I reviewed the CT scan films with them and answered all of their questions.  Plan:  Excisional biopsy of the right neck mass on Tuesday 02/16/2016.  Gaye Pollack, MD [facility] [phone]

## 2016-02-16 NOTE — Progress Notes (Signed)
Patient ID: Bryce Ward, male   DOB: 08-16-1970, 46 y.o.   MRN: [id number]   SICU Evening Rounds:   Hemodynamically stable   Awake and alert, voice normal Taking liquids and swallowing fine Breathing well.  Urine output good  JP output minimal  CBC    Component Value Date/Time   WBC 5.8 02/16/2016 0836   RBC 5.40 02/16/2016 0836   HGB 16.4 02/16/2016 0836   HCT 48.0 02/16/2016 0836   PLT 156 02/16/2016 0836   MCV 88.9 02/16/2016 0836   MCH 30.4 02/16/2016 0836   MCHC 34.2 02/16/2016 0836   RDW 13.0 02/16/2016 0836     BMET    Component Value Date/Time   NA 137 02/16/2016 0836   K 3.1 (L) 02/16/2016 0836   CL 100 (L) 02/16/2016 0836   CO2 28 02/16/2016 0836   GLUCOSE 125 (H) 02/16/2016 0836   BUN 12 02/16/2016 0836   CREATININE 0.80 02/16/2016 0836   CALCIUM 9.4 02/16/2016 0836   GFRNONAA >60 02/16/2016 0836   GFRAA >60 02/16/2016 0836     A/P:  Stable postop course. Continue current plans

## 2016-02-17 ENCOUNTER — Encounter (HOSPITAL_COMMUNITY): Payer: Self-pay | Admitting: Physician Assistant

## 2016-02-17 DIAGNOSIS — M7981 Nontraumatic hematoma of soft tissue: Secondary | ICD-10-CM | POA: Diagnosis not present

## 2016-02-17 MED ORDER — OXYCODONE HCL 5 MG PO TABS: 5.0000 mg | ORAL_TABLET | ORAL | 0 refills | Status: DC | PRN

## 2016-02-17 NOTE — Discharge Instructions (Signed)
Cuidado de la incisin (Incision Care) La incisin es el corte que el cirujano realiza en el cuerpo. Despus de la [place], es necesario brindarle el cuidado necesario para evitar que se infecte. [place] los medicamentos solamente como se lo haya indicado el mdico.  Hay muchas maneras distintas de cerrar y cubrir un corte, como puntos, pegamento para la piel y tiras [place]. Siga las indicaciones del mdico para:  Cuidar del corte.  Cambiar y Press photographer el vendaje.  Quitar el cierre del corte.  No tome baos de inmersin, no practique natacin ni use el jacuzzi hasta que el mdico lo autorice. Puede ducharse como se lo haya indicado el mdico.  Reanude su dieta y sus actividades habituales como se lo haya indicado el mdico.  Use un medicamento que reduzca la picazn en el corte como se lo haya indicado el mdico. No se toque ni se rasque el corte.  Beba suficiente lquido para mantener el pis (orina) claro o de color amarillo plido. SOLICITE AYUDA SI:  Tiene enrojecimiento, hinchazn o dolor en el lugar del corte.  Observa lquido, sangre o pus que sale del corte.  Le duelen los msculos.  Tiene nuseas o escalofros.  Advierte un olor ftido que proviene de la herida o del vendaje.  El corte se abre despus de que le [employer] puntos, las grapas o las tiras [place].  Sigue teniendo Higher education careers adviser (nuseas) o vmitos que no se interrumpen.  Tiene fiebre.  Tiene mareos. SOLICITE AYUDA DE INMEDIATO SI:  Tiene una erupcin cutnea.  Pierde el conocimiento (se desmaya).  Tiene dificultad para respirar. ASEGRESE DE QUE:  Comprende estas instrucciones.  Controlar su afeccin.  Recibir ayuda de inmediato si no mejora o si empeora. Esta informacin no tiene Marine scientist el consejo del mdico. Asegrese de hacerle al mdico cualquier pregunta que tenga. Document Released: 06/21/2011 Document Revised: 01/10/2014 Document  Reviewed: 07/08/2015 Elsevier Interactive Patient Education  2017 [employer].

## 2016-02-17 NOTE — Progress Notes (Signed)
Reviewed AVS with patient and wife via interpretor. Patient and wife both verbalize understanding and have no questions. Removed patients IV and walked with him 357ft around unit. Patient tolerated it well. Patients vitals stable and complains of no pain. Patient is being discharged via volunteer in a wheelchair.

## 2016-02-17 NOTE — Op Note (Signed)
CARDIOVASCULAR SURGERY OPERATIVE NOTE  02/16/2016 Bryce Ward [id number]  Surgeon:  Bryce Pollack, MD  First Assistant: Bryce Rough,  PA-C   Preoperative Diagnosis:  Right supraclavicular neck mass   Postoperative Diagnosis:  Same   Procedure:  1. Excision of right supraclavicular neck mass  Anesthesia:  General Endotracheal   Clinical History/Surgical Indication:  The patient is a 46 year old Hispanic gentleman who does not speak English but is here with his daughter who does speak [place] well. The patient and his wife, who is also here, understand some [place]. He has noticed a mass at the base of the right neck for about the past two years. It has been slowly growing. It has not caused much symptoms so he has ignored it for the most part. He was reportedly seen by a physician at some point and put on antibiotics but it did not resolve and he never went back. He does note some mild discomfort with raising the right arm above his head. He denies any fever or chills, night sweats, weight loss or loss of appetite. He has not noted any change in his voice or difficulty swallowing. He was seen by Dr. Marlou Ward on 01/22/2016 and subsequently had a chest CT to evaluate this. It shows a 5 cm intermediate density mass posterior to the lower right SCM muscle. There is no significant lymphadenopathy and no other significant abnormality.  Preparation:  The patient was seen in the preoperative holding area and the correct patient, correct operation were confirmed with the patient after reviewing the medical record and CT scan. The consent was signed by me. Preoperative antibiotics were given. The patient was taken back to the operating room and positioned supine on the operating room table. After being placed under general endotracheal anesthesia by the anesthesia team the neck and chest were prepped with betadine soap and solution and draped in the usual sterile manner. A surgical time-out was  taken and the correct patient and operative procedure were confirmed with the nursing and anesthesia staff.   Excision of right supraclavicular neck mass:  A 4 cm oblique incision was made along the anterior border of the right sternocleidomastoid muscle. It was retracted laterally to expose the mass. The mass was separated from the muscle and appeared encapsulated and not actively invading any structures. It was firm and non-mobile and took considerable time to remove due to the multiple arterial and venous branches that were large but short in length making exposure difficult. Dissection was continued around the mass inferiorly and there was a large arterial branch off the proximal subclavian artery that was entering the mass. It was suture ligated with 2-0 silk and divided. There were two vein branches from the mass to the subclavian vein that were ligated and divided. Dissection was continued medially along the carotid sheath and IJ vein. There were three arterial and venous branches going to the mass and these were all moderate sized and suture ligated and divided. After further mobilization the lateral edge of the mass was exposed and there were two more arterial branches entering the mass from the axillary artery. These were suture ligated and divided. This completely freed the mass and it was removed and sent to pathology. There was complete hemostasis. A 17F JP drain was placed in the wound bed via a small stab incision. The fascia of the SCM muscle was approximated to the carotid sheath. The platysma muscle was closed with continuous 3-0 Vicryl suture. The skin was closed with  continuous 3-0 Vicryl subcuticular suture. He was awakened and extubated and then transported to the PACU in good condition.

## 2016-02-17 NOTE — Progress Notes (Signed)
Interpreter Lesle Chris for RN Discharge

## 2016-02-17 NOTE — Anesthesia Postprocedure Evaluation (Addendum)
Anesthesia Post Note  Patient: Bryce Ward  Procedure(s) Performed: Procedure(s) (LRB): EXCISIONAL BIOPSY OF RIGHT NECK MASS (Right)  Patient location during evaluation: PACU Anesthesia Type: General Level of consciousness: awake and alert Pain management: pain level controlled Vital Signs Assessment: post-procedure vital signs reviewed and stable Respiratory status: spontaneous breathing, nonlabored ventilation, respiratory function stable and patient connected to nasal cannula oxygen Cardiovascular status: blood pressure returned to baseline and stable Postop Assessment: no signs of nausea or vomiting Anesthetic complications: no       Last Vitals:  Vitals:   02/17/16 0500 02/17/16 0600  BP: 125/80 120/80  Pulse: 68 75  Resp: 15 15  Temp:      Last Pain:  Vitals:   02/17/16 0400  TempSrc: Oral  PainSc:                  Ghazal Pevey,JAMES TERRILL

## 2016-02-17 NOTE — Discharge Summary (Signed)
Physician Discharge Summary  Patient ID: Bryce Ward MRN: [id number] DOB/AGE: [birth year] 44 y.o.  Admit date: 02/16/2016 Discharge date: 02/17/2016  Admission Diagnoses: Right Supraclavicular Mass  Discharge Diagnoses: S/P excisional biopsy of right supraclavicular mass  Discharged Condition: good  HPI: The patient is a 46 year old Hispanic gentleman who does not speak English but is here with his daughter who does speak [place] well. The patient and his wife, who is also here, understand some [place]. He has noticed a mass at the base of the right neck for about the past two years. It has been slowly growing. It has not caused much symptoms so he has ignored it for the most part. He was reportedly seen by a physician at some point and put on antibiotics but it did not resolve and he never went back. He does note some mild discomfort with raising the right arm above his head. He denies any fever or chills, night sweats, weight loss or loss of appetite. He has not noted any change in his voice or difficulty swallowing. He was seen by Dr. Marlou Starks on 01/22/2016 and subsequently had a chest CT to evaluate this. It shows a 5 cm intermediate density mass posterior to the lower right SCM muscle. There is no significant lymphadenopathy and no other significant abnormality.  Hospital Course: Patient underwent excisional biopsy of right supraclavicular mass on 02/16/16 and was admitted to the hospital overnight for post-operative observation. Patient tolerated procedure well and without complication. Patient had JP drain placed intraoperatively, this was removed prior to discharge without complication.   Consults: None  Significant Diagnostic Studies: Pathology  Treatments: surgery: excisional biopsy  Discharge Exam: Blood pressure 120/77, pulse 74, temperature 98.5 F (36.9 C), temperature source Oral, resp. rate 17, height 5\' 8"  (1.727 m), weight 93 kg (205 lb), SpO2 97 %. General appearance:  alert and cooperative Neurologic: intact Heart: regular rate and rhythm, S1, S2 normal, no murmur, click, rub or gallop Lungs: clear to auscultation bilaterally Wound: incision ok Minimal drainage from JP   Disposition: 01-Home or Self Care  Discharge Instructions    Discharge patient    Complete by:  As directed    Discharge disposition:  01-Home or Self Care   Discharge patient date:  02/17/2016     Allergies as of 02/17/2016   No Known Allergies     Medication List    TAKE these medications   naproxen sodium 220 MG tablet Commonly known as:  ANAPROX Take 440 mg by mouth 2 (two) times daily as needed (pain).   oxyCODONE 5 MG immediate release tablet Commonly known as:  Oxy IR/ROXICODONE Take 1-2 tablets (5-10 mg total) by mouth every 4 (four) hours as needed for severe pain.      Follow-up Information    Gaye Pollack, MD. Go on 02/24/2016.   Specialty:  Cardiothoracic Surgery Why:  Appointment is at 4:00 PM.  Contact information: [address] [place] [address] [phone]           Signed: Brigid Re PA-S 02/17/2016, 9:27 AM

## 2016-02-17 NOTE — Progress Notes (Signed)
1 Day Post-Op Procedure(s) (LRB): EXCISIONAL BIOPSY OF RIGHT NECK MASS (Right) Subjective:  No complaints  Objective: Vital signs in last 24 hours: Temp:  [97.4 F (36.3 C)-99.2 F (37.3 C)] 99 F (37.2 C) (02/14 0400) Pulse Rate:  [68-94] 74 (02/14 0800) Cardiac Rhythm: Normal sinus rhythm (02/14 0800) Resp:  [5-23] 17 (02/14 0800) BP: (116-139)/(72-87) 120/77 (02/14 0800) SpO2:  [94 %-100 %] 97 % (02/14 0800)  Hemodynamic parameters for last 24 hours:    Intake/Output from previous day: 02/13 0701 - 02/14 0700 In: 2395 [P.O.:240; I.V.:1890; IV Piggyback:265] Out: [id number] [Urine:2200; Drains:25; Blood:250] Intake/Output this shift: Total I/O In: 10 [I.V.:10] Out: -   General appearance: alert and cooperative Neurologic: intact Heart: regular rate and rhythm, S1, S2 normal, no murmur, click, rub or gallop Lungs: clear to auscultation bilaterally Wound: incision ok Minimal drainage from JP  Lab Results:  Recent Labs  02/16/16 0836  WBC 5.8  HGB 16.4  HCT 48.0  PLT 156   BMET:  Recent Labs  02/16/16 0836  NA 137  K 3.1*  CL 100*  CO2 28  GLUCOSE 125*  BUN 12  CREATININE 0.80  CALCIUM 9.4    PT/INR:  Recent Labs  02/16/16 0836  LABPROT 13.1  INR 0.99   ABG No results found for: PHART, HCO3, TCO2, ACIDBASEDEF, O2SAT CBG (last 3)  No results for input(s): GLUCAP in the last 72 hours.  Assessment/Plan: S/P Procedure(s) (LRB): EXCISIONAL BIOPSY OF RIGHT NECK MASS (Right)  He is doing well. Remove JP Plan home today I will see in the office next Wednesday and will discuss pathology with him then.   LOS: 1 day    Gaye Pollack 02/17/2016

## 2016-02-24 ENCOUNTER — Ambulatory Visit (INDEPENDENT_AMBULATORY_CARE_PROVIDER_SITE_OTHER): Payer: Self-pay | Admitting: Surgery

## 2016-02-24 ENCOUNTER — Encounter: Payer: Self-pay | Admitting: Surgery

## 2016-02-24 VITALS — BP 134/82 | HR 84 | Resp 20 | Ht 68.0 in | Wt 205.0 lb

## 2016-02-24 DIAGNOSIS — R222 Localized swelling, mass and lump, trunk: Secondary | ICD-10-CM

## 2016-02-24 DIAGNOSIS — Z09 Encounter for follow-up examination after completed treatment for conditions other than malignant neoplasm: Secondary | ICD-10-CM

## 2016-02-25 ENCOUNTER — Encounter: Payer: Self-pay | Admitting: Surgery

## 2016-02-25 NOTE — Progress Notes (Signed)
     HPI: Patient returns for routine postoperative follow-up having undergone excision of a large right supraclavicular neck mass on 02/16/2016. The patient's early postoperative recovery while in the hospital was notable for an uncomplicated recovery. The drain was removed the following morning and he was discharge. Since hospital discharge the patient reports that he has been feeling well and would like to return to work.   No current outpatient prescriptions on file.   No current facility-administered medications for this visit.     Physical Exam: BP 134/82   Pulse 84   Resp 20   Ht 5\' 8"  (1.727 m)   Wt 205 lb (93 kg)   SpO2 98% Comment: RA  BMI 31.17 kg/m  He looks well Face is symmetrical and tongue midline. Neuro intact The neck incision is healing well There is no swelling in the supraclavicular fossa. The right upper extremity is neurovascularly intact.  Diagnostic Tests:  FINAL DIAGNOSIS Diagnosis Soft tissue mass, simple excision, Right Supraclavicular - ORGANIZING HEMATOMA. - NO EVIDENCE OF MALIGNANCY. - SEE MICROSCOPIC DESCRIPTION Microscopic Comment The differential diagnosis includes a thrombosed vein, a traumatic soft tissue hematoma and a thromboses hemangioma. (JDP:kh 02-17-16) Claudette Laws MD Pathologist, Electronic Signature (Case signed 02/17/2016) Specimen Gross and Clinical Information Specimen(s) Obtained: Soft tissue mass, simple excision, Right Supraclavicular Specimen Clinical Information Right neck mass (nt) Gross The specimen is received in saline and consists of a 6.5 x 3.7 x 2.4 cm nodular portion of tan-red soft tissue. The outer surface is entirely inked black, and the specimen is serially sectioned to reveal a 5.8 x 2.9 x 2.3 cm tan-red, hemorrhagic and partially cystic well circumscribed lesion. A small amount of tissue surrounding the lesion consists of yellow lobulated adipose tissue. The lesion abuts multiple inked margins. A  representative portion of soft tissue is placed in RPMI for possible flow cytometry, and sections are submitted in four cassettes. (KL:ecj 02/16/2016) Report signed out from the following location(s) Technical component and interpretation was performed at [facility] [place], [place], [place] [address]. CLIA #: [id number]  Impression:  He is making a good recovery. I discussed the pathology with him and his wife and daughter who speaks [place] and interprets for him. 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 This was completely excised and does not require any further treatment or follow up. I told him that he can return to normal activity.  Plan:  He will return to see me if he has any problems with the incision.   Gaye Pollack, MD [facility] [phone]

## 2016-06-03 NOTE — Addendum Note (Signed)
Addendum  created 06/03/16 1121 by Rica Koyanagi, MD   Sign clinical note

## 2017-11-28 IMAGING — CR DG CHEST 2V
2 series · 2 of 2 positions shown · non-contrast
Comparison: None ; correlation CT chest 02/05/2016

CLINICAL DATA: Preoperative testing, RIGHT neck mass

EXAM:
CHEST  2 VIEW

[w chest pa]
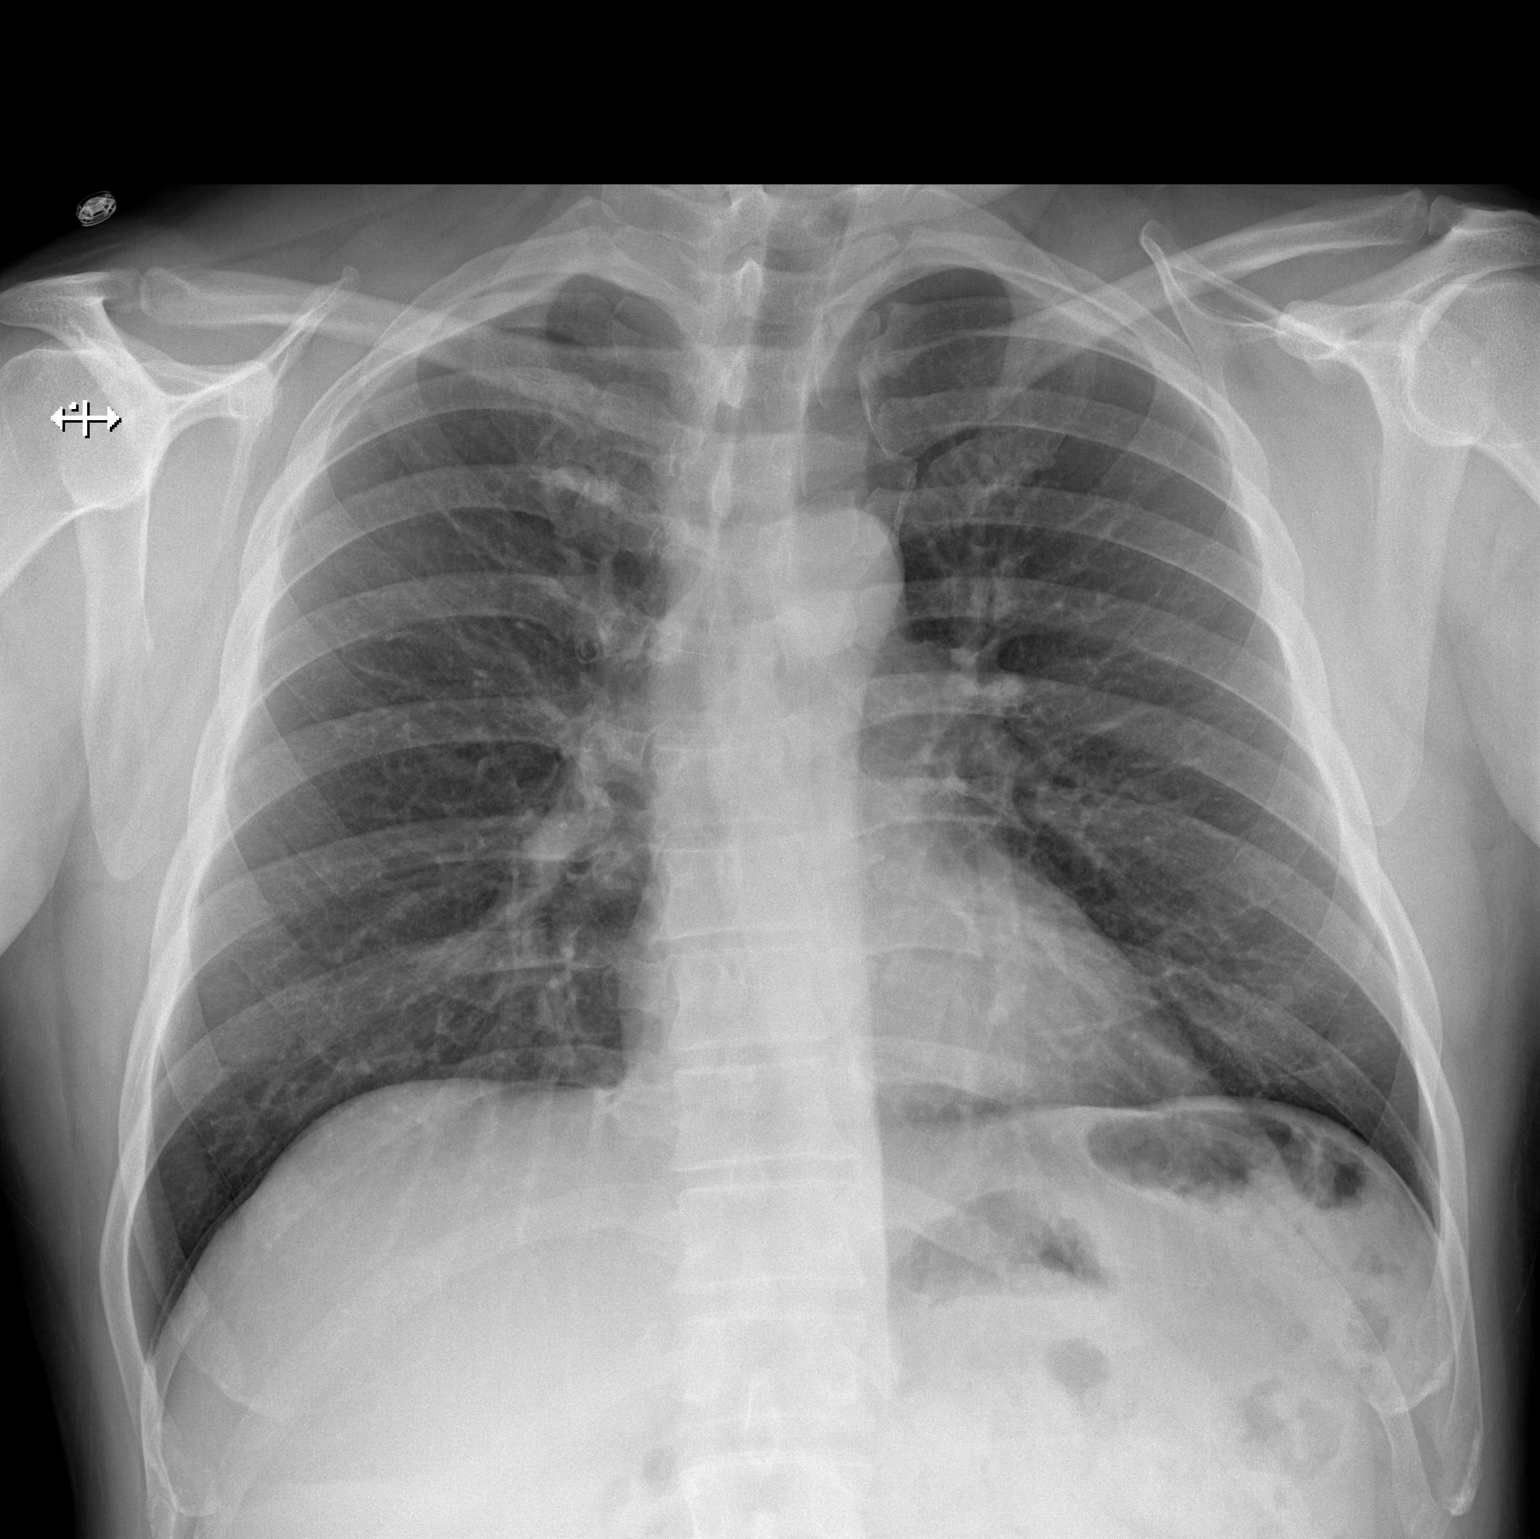

[w chest lat]
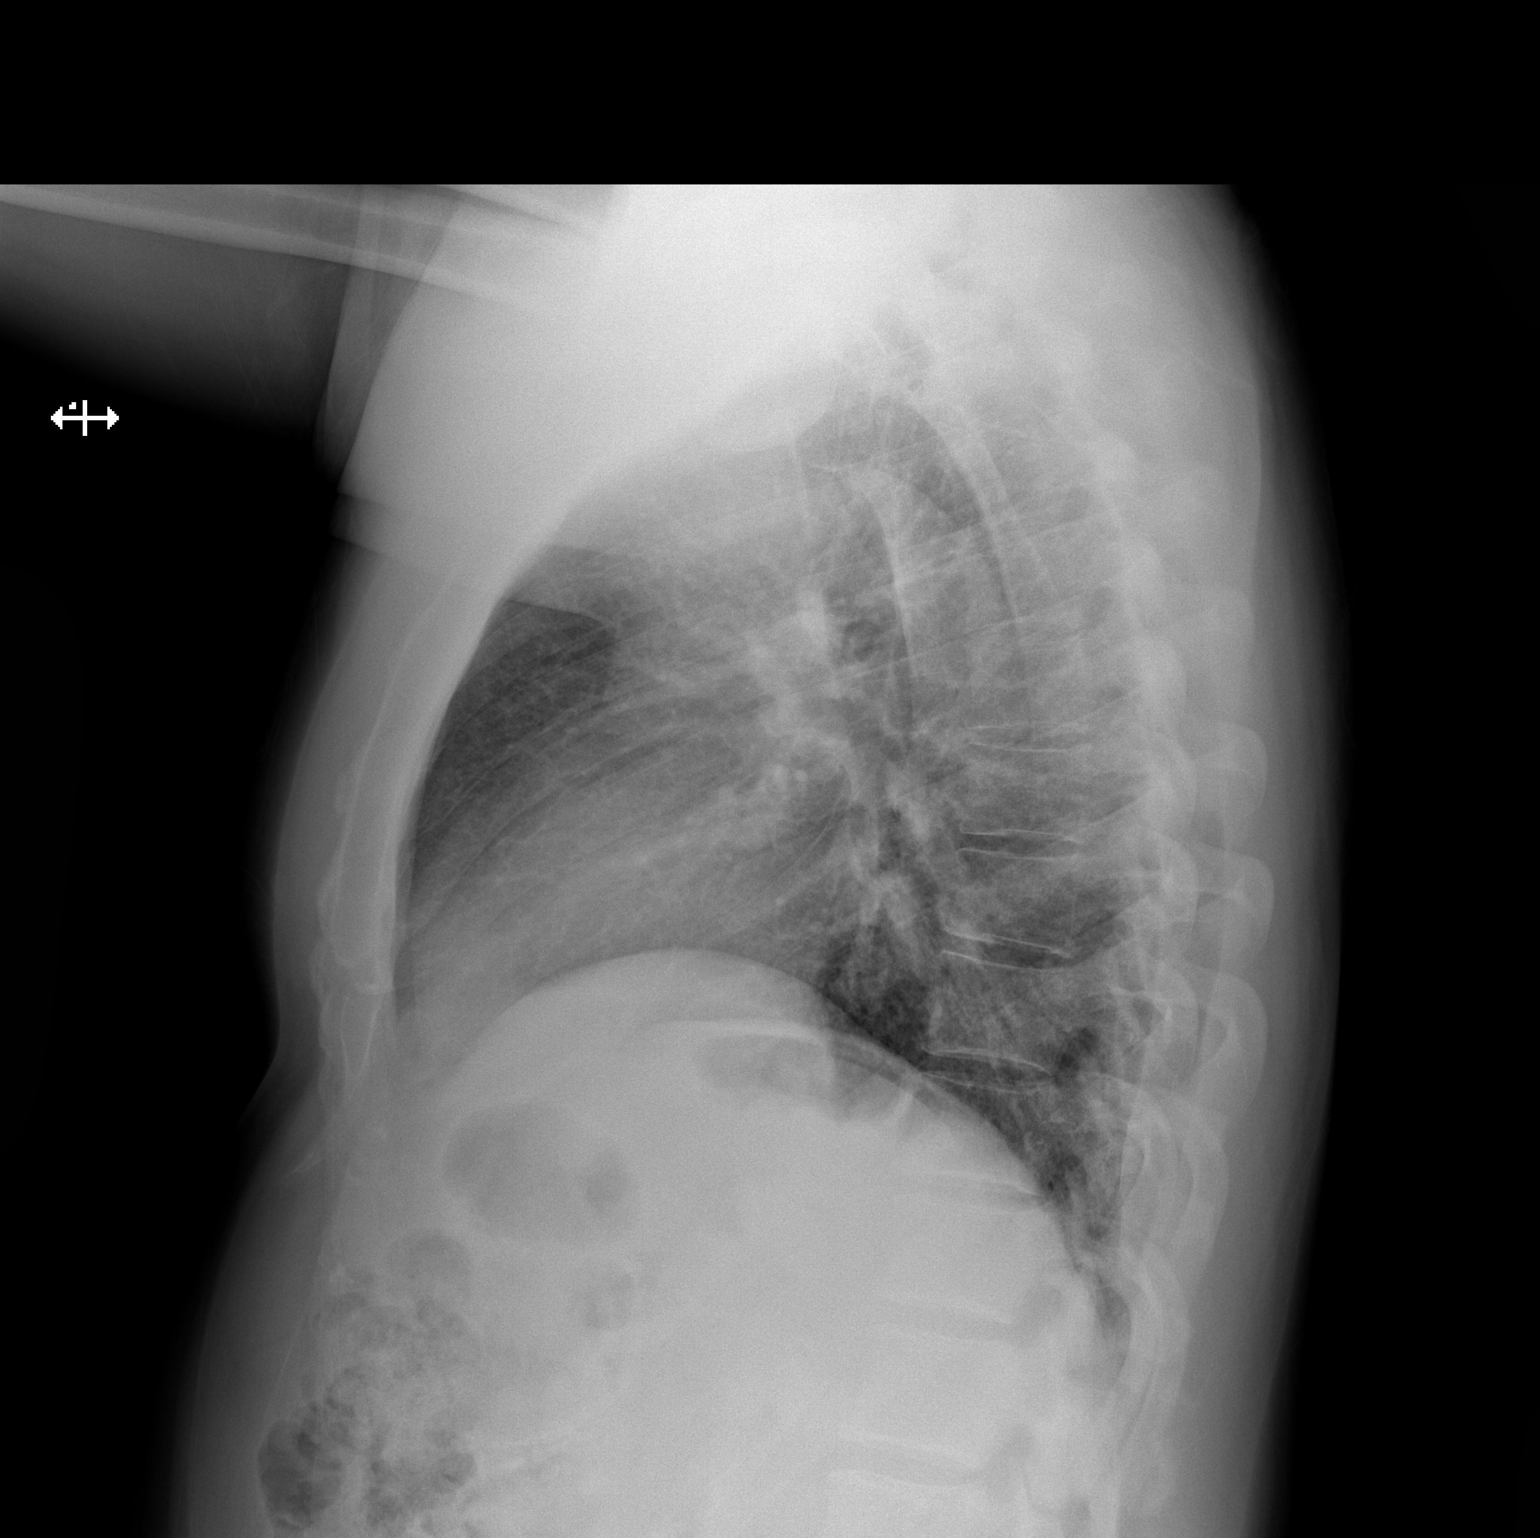

[2 of 2 positions shown; findings below may reference images not displayed]

FINDINGS: Head turned to the LEFT.

Normal heart size, mediastinal contours, and pulmonary vascularity.

Lungs clear.

No pleural effusion or pneumothorax.

Minimal central peribronchial thickening.

Probable LEFT nipple shadow, with no pulmonary nodule present at
this site on recent CT chest.

Minimal bronchitic changes without infiltrate.
IMPRESSION: Minimal bronchitic changes without infiltrate.
